# Patient Record
Sex: Female | Born: 1960 | Race: Black or African American | Hispanic: No | Marital: Married | Smoking: Former smoker
Health system: Southern US, Community
[De-identification: ages and names within clinical notes are randomized; demographics above are authoritative.]

## PROBLEM LIST (undated history)

## (undated) DIAGNOSIS — I639 Cerebral infarction, unspecified: Secondary | ICD-10-CM

## (undated) DIAGNOSIS — I10 Essential (primary) hypertension: Secondary | ICD-10-CM

## (undated) DIAGNOSIS — E785 Hyperlipidemia, unspecified: Secondary | ICD-10-CM

## (undated) DIAGNOSIS — K219 Gastro-esophageal reflux disease without esophagitis: Secondary | ICD-10-CM

## (undated) DIAGNOSIS — E119 Type 2 diabetes mellitus without complications: Secondary | ICD-10-CM

## (undated) DIAGNOSIS — Z5189 Encounter for other specified aftercare: Secondary | ICD-10-CM

## (undated) DIAGNOSIS — D649 Anemia, unspecified: Secondary | ICD-10-CM

## (undated) HISTORY — DX: Hyperlipidemia, unspecified: E78.5

## (undated) HISTORY — DX: Essential (primary) hypertension: I10

## (undated) HISTORY — DX: Gastro-esophageal reflux disease without esophagitis: K21.9

## (undated) HISTORY — DX: Type 2 diabetes mellitus without complications: E11.9

## (undated) HISTORY — DX: Anemia, unspecified: D64.9

## (undated) HISTORY — DX: Encounter for other specified aftercare: Z51.89

## (undated) HISTORY — DX: Cerebral infarction, unspecified: I63.9

## (undated) HISTORY — PX: UTERINE FIBROID SURGERY: SHX826

---

## 2008-09-15 ENCOUNTER — Emergency Department: Payer: Self-pay | Admitting: Emergency Medicine

## 2010-02-20 ENCOUNTER — Inpatient Hospital Stay: Payer: Self-pay | Admitting: Internal Medicine

## 2010-06-06 ENCOUNTER — Emergency Department: Payer: Self-pay | Admitting: Emergency Medicine

## 2013-04-27 ENCOUNTER — Encounter: Payer: Self-pay | Admitting: *Deleted

## 2013-05-26 ENCOUNTER — Encounter: Payer: Self-pay | Admitting: General Surgery

## 2013-05-26 ENCOUNTER — Ambulatory Visit (INDEPENDENT_AMBULATORY_CARE_PROVIDER_SITE_OTHER): Payer: BC Managed Care – PPO | Admitting: General Surgery

## 2013-05-26 VITALS — BP 130/78 | HR 74 | Resp 12 | Ht 68.0 in | Wt 261.0 lb

## 2013-05-26 DIAGNOSIS — Z1211 Encounter for screening for malignant neoplasm of colon: Secondary | ICD-10-CM

## 2013-05-26 MED ORDER — POLYETHYLENE GLYCOL 3350 17 GM/SCOOP PO POWD: ORAL | Status: DC

## 2013-05-26 NOTE — Addendum Note (Signed)
Addended by: Kieth Brightly on: 05/26/2013 10:04 AM   Modules accepted: Orders

## 2013-05-26 NOTE — Patient Instructions (Addendum)
Colonoscopy A colonoscopy is an exam to evaluate your entire colon. In this exam, your colon is cleansed. A long fiberoptic tube is inserted through your rectum and into your colon. The fiberoptic scope (endoscope) is a long bundle of enclosed and very flexible fibers. These fibers transmit light to the area examined and send images from that area to your caregiver. Discomfort is usually minimal. You may be given a drug to help you sleep (sedative) during or prior to the procedure. This exam helps to detect lumps (tumors), polyps, inflammation, and areas of bleeding. Your caregiver may also take a small piece of tissue (biopsy) that will be examined under a microscope. LET YOUR CAREGIVER KNOW ABOUT:   Allergies to food or medicine.  Medicines taken, including vitamins, herbs, eyedrops, over-the-counter medicines, and creams.  Use of steroids (by mouth or creams).  Previous problems with anesthetics or numbing medicines.  History of bleeding problems or blood clots.  Previous surgery.  Other health problems, including diabetes and kidney problems.  Possibility of pregnancy, if this applies. BEFORE THE PROCEDURE   A clear liquid diet may be required for 2 days before the exam.  Ask your caregiver about changing or stopping your regular medications.  Liquid injections (enemas) or laxatives may be required.  A large amount of electrolyte solution may be given to you to drink over a short period of time. This solution is used to clean out your colon.  You should be present 60 minutes prior to your procedure or as directed by your caregiver. AFTER THE PROCEDURE   If you received a sedative or pain relieving medication, you will need to arrange for someone to drive you home.  Occasionally, there is a little blood passed with the first bowel movement. Do not be concerned. FINDING OUT THE RESULTS OF YOUR TEST Not all test results are available during your visit. If your test results are  not back during the visit, make an appointment with your caregiver to find out the results. Do not assume everything is normal if you have not heard from your caregiver or the medical facility. It is important for you to follow up on all of your test results. HOME CARE INSTRUCTIONS   It is not unusual to pass moderate amounts of gas and experience mild abdominal cramping following the procedure. This is due to air being used to inflate your colon during the exam. Walking or a warm pack on your belly (abdomen) may help.  You may resume all normal meals and activities after sedatives and medicines have worn off.  Only take over-the-counter or prescription medicines for pain, discomfort, or fever as directed by your caregiver. Do not use aspirin or blood thinners if a biopsy was taken. Consult your caregiver for medicine usage if biopsies were taken. SEEK IMMEDIATE MEDICAL CARE IF:   You have a fever.  You pass large blood clots or fill a toilet with blood following the procedure. This may also occur 10 to 14 days following the procedure. This is more likely if a biopsy was taken.  You develop abdominal pain that keeps getting worse and cannot be relieved with medicine. Document Released: 05/23/2000 Document Revised: 08/18/2011 Document Reviewed: 12/27/2012 [facility] Patient Information 2014 [place], [place].  Patient has been scheduled for a colonoscopy on 06-08-13 at [facility]. This patient has been asked to hold metformin day of colonoscopy prep and procedure. It is okay for her to continue her glipizide day of prep but will hold day of procedure.

## 2013-05-26 NOTE — Progress Notes (Signed)
Patient ID: Maria Gordon, female   DOB: 31-May-1961, 52 y.o.   MRN: [id number]  Chief Complaint  Patient presents with  . Other    colonoscopy    HPI Maria Gordon is a 52 y.o. female.  Here today to discuss having a colonoscopy.  She has not had any in the past. Denies any gastrointestinal issues.  HPI  Past Medical History  Diagnosis Date  . Hypertension   . Diabetes mellitus without complication   . Stroke   . Hyperlipidemia   . Anemia   . Blood transfusion without reported diagnosis   . GERD (gastroesophageal reflux disease)     Past Surgical History  Procedure Laterality Date  . Uterine fibroid surgery      Family History  Problem Relation Age of Onset  . Lung cancer Brother     Social History History  Substance Use Topics  . Smoking status: Former Smoker -- 1.00 packs/day for 25 years  . Smokeless tobacco: Never Used  . Alcohol Use: No    Allergies  Allergen Reactions  . Shrimp [Shellfish Allergy] Rash    Current Outpatient Prescriptions  Medication Sig Dispense Refill  . atorvastatin (LIPITOR) 40 MG tablet Take 1 tablet by mouth daily at 6 (six) AM.      . carvedilol (COREG) 6.25 MG tablet Take 1 tablet by mouth daily.      Marland Kitchen GLIPIZIDE XL 5 MG 24 hr tablet Take 1 tablet by mouth daily.      Marland Kitchen lisinopril-hydrochlorothiazide (PRINZIDE,ZESTORETIC) 20-12.5 MG per tablet Take 1 tablet by mouth daily.      . metFORMIN (GLUCOPHAGE-XR) 500 MG 24 hr tablet Take 1 tablet by mouth daily.      . polyethylene glycol powder (GLYCOLAX/MIRALAX) powder 255 grams one bottle for colonoscopy prep  255 g  0   No current facility-administered medications for this visit.    Review of Systems Review of Systems  Constitutional: Negative.   Respiratory: Negative.   Cardiovascular: Negative.   Gastrointestinal: Negative.     Blood pressure 130/78, pulse 74, resp. rate 12, height 5\' 8"  (1.727 m), weight 261 lb (118.389 kg).  Physical Exam Physical Exam   Constitutional: She is oriented to person, place, and time. She appears well-developed and well-nourished.  Eyes: Conjunctivae are normal. No scleral icterus.  Neck: Neck supple.  Cardiovascular: Normal rate, regular rhythm and normal heart sounds.   Pulmonary/Chest: Effort normal and breath sounds normal.  Abdominal: Soft. Bowel sounds are normal. There is no tenderness. No hernia.  Lymphadenopathy:    She has no cervical adenopathy.  Neurological: She is alert and oriented to person, place, and time.  Skin: Skin is warm and dry.    Data Reviewed none  Assessment    Stable physical exam.    Plan    Colonoscopy with possible biopsy/polypectomy prn: Information regarding the procedure, including its potential risks and complications (including but not limited to perforation of the bowel, which may require emergency surgery to repair, and bleeding) was verbally given to the patient. Educational information regarding lower instestinal endoscopy was given to the patient. Written instructions for how to complete the bowel prep using Miralax were provided. The importance of drinking ample fluids to avoid dehydration as a result of the prep emphasized.    Patient has been scheduled for a colonoscopy on 06-08-13 at [facility]. This patient has been asked to hold metformin day of colonoscopy prep and procedure. It is okay for her to continue her glipizide  day of prep but will hold day of procedure.    Lunetta Marina G 05/26/2013, 9:49 AM

## 2013-06-08 ENCOUNTER — Ambulatory Visit: Payer: Self-pay | Admitting: General Surgery

## 2013-06-08 DIAGNOSIS — Z1211 Encounter for screening for malignant neoplasm of colon: Secondary | ICD-10-CM

## 2013-06-14 ENCOUNTER — Encounter: Payer: Self-pay | Admitting: General Surgery

## 2014-04-10 ENCOUNTER — Encounter: Payer: Self-pay | Admitting: General Surgery

## 2015-03-15 ENCOUNTER — Emergency Department: Payer: Self-pay

## 2015-03-15 ENCOUNTER — Other Ambulatory Visit: Payer: Self-pay

## 2015-03-15 ENCOUNTER — Emergency Department
Admission: EM | Admit: 2015-03-15 | Discharge: 2015-03-15 | Disposition: A | Discharge status: Home / Self Care | Payer: Self-pay | Attending: Emergency Medicine | Admitting: Emergency Medicine

## 2015-03-15 DIAGNOSIS — J40 Bronchitis, not specified as acute or chronic: Secondary | ICD-10-CM | POA: Insufficient documentation

## 2015-03-15 DIAGNOSIS — Z7982 Long term (current) use of aspirin: Secondary | ICD-10-CM | POA: Insufficient documentation

## 2015-03-15 DIAGNOSIS — E041 Nontoxic single thyroid nodule: Secondary | ICD-10-CM | POA: Insufficient documentation

## 2015-03-15 DIAGNOSIS — I10 Essential (primary) hypertension: Secondary | ICD-10-CM | POA: Insufficient documentation

## 2015-03-15 DIAGNOSIS — E278 Other specified disorders of adrenal gland: Secondary | ICD-10-CM | POA: Insufficient documentation

## 2015-03-15 DIAGNOSIS — Z79899 Other long term (current) drug therapy: Secondary | ICD-10-CM | POA: Insufficient documentation

## 2015-03-15 DIAGNOSIS — Z87891 Personal history of nicotine dependence: Secondary | ICD-10-CM | POA: Insufficient documentation

## 2015-03-15 DIAGNOSIS — F419 Anxiety disorder, unspecified: Secondary | ICD-10-CM | POA: Insufficient documentation

## 2015-03-15 DIAGNOSIS — E119 Type 2 diabetes mellitus without complications: Secondary | ICD-10-CM | POA: Insufficient documentation

## 2015-03-15 LAB — COMPREHENSIVE METABOLIC PANEL
ALBUMIN: 4.4 g/dL (ref 3.5–5.0)
ALT: 47 U/L (ref 14–54)
ANION GAP: 8 (ref 5–15)
AST: 41 U/L (ref 15–41)
Alkaline Phosphatase: 95 U/L (ref 38–126)
BUN: 8 mg/dL (ref 6–20)
CO2: 26 mmol/L (ref 22–32)
Calcium: 9.5 mg/dL (ref 8.9–10.3)
Chloride: 105 mmol/L (ref 101–111)
Creatinine, Ser: 0.69 mg/dL (ref 0.44–1.00)
GFR calc Af Amer: >60 mL/min (ref >60)
GFR calc non Af Amer: >60 mL/min (ref >60)
GLUCOSE: 180 mg/dL — AB (ref 65–99)
POTASSIUM: 3.9 mmol/L (ref 3.5–5.1)
SODIUM: 139 mmol/L (ref 135–145)
Total Bilirubin: 0.5 mg/dL (ref 0.3–1.2)
Total Protein: 8.2 g/dL — ABNORMAL HIGH (ref 6.5–8.1)

## 2015-03-15 LAB — CBC WITH DIFFERENTIAL/PLATELET
BASOS PCT: 1 %
Basophils Absolute: 0.1 10*3/uL (ref 0–0.1)
EOS ABS: 0.1 10*3/uL (ref 0–0.7)
Eosinophils Relative: 1 %
HCT: 40.3 % (ref 35.0–47.0)
HEMOGLOBIN: 12.9 g/dL (ref 12.0–16.0)
Lymphocytes Relative: 26 %
Lymphs Abs: 2.3 10*3/uL (ref 1.0–3.6)
MCH: 25.5 pg — ABNORMAL LOW (ref 26.0–34.0)
MCHC: 32.1 g/dL (ref 32.0–36.0)
MCV: 79.6 fL — ABNORMAL LOW (ref 80.0–100.0)
MONO ABS: 0.5 10*3/uL (ref 0.2–0.9)
MONOS PCT: 6 %
NEUTROS PCT: 66 %
Neutro Abs: 5.8 10*3/uL (ref 1.4–6.5)
Platelets: 193 10*3/uL (ref 150–440)
RBC: 5.07 MIL/uL (ref 3.80–5.20)
RDW: 14.1 % (ref 11.5–14.5)
WBC: 8.7 10*3/uL (ref 3.6–11.0)

## 2015-03-15 LAB — TROPONIN I: Troponin I: <0.03 ng/mL (ref <0.031)

## 2015-03-15 LAB — FIBRIN DERIVATIVES D-DIMER (ARMC ONLY): Fibrin derivatives D-dimer (ARMC): 816 — ABNORMAL HIGH (ref 0–499)

## 2015-03-15 MED ORDER — IPRATROPIUM-ALBUTEROL 0.5-2.5 (3) MG/3ML IN SOLN
3.0000 mL | Freq: Once | RESPIRATORY_TRACT | Status: AC
  Administered 2015-03-15: 3 mL via RESPIRATORY_TRACT
  Filled 2015-03-15: qty 3

## 2015-03-15 MED ORDER — LORAZEPAM 1 MG PO TABS
1.0000 mg | ORAL_TABLET | Freq: Once | ORAL | Status: AC
  Administered 2015-03-15: 1 mg via ORAL
  Filled 2015-03-15: qty 1

## 2015-03-15 MED ORDER — SODIUM CHLORIDE 0.9 % IV BOLUS (SEPSIS)
1000.0000 mL | Freq: Once | INTRAVENOUS | Status: AC
  Administered 2015-03-15: 1000 mL via INTRAVENOUS

## 2015-03-15 MED ORDER — IOHEXOL 350 MG/ML SOLN
100.0000 mL | Freq: Once | INTRAVENOUS | Status: AC | PRN
  Administered 2015-03-15: 100 mL via INTRAVENOUS

## 2015-03-15 MED ORDER — ALBUTEROL SULFATE (2.5 MG/3ML) 0.083% IN NEBU
2.5000 mg | INHALATION_SOLUTION | RESPIRATORY_TRACT | Status: DC | PRN
  Administered 2015-03-15: 2.5 mg via RESPIRATORY_TRACT
  Filled 2015-03-15: qty 3

## 2015-03-15 MED ORDER — ALPRAZOLAM 0.25 MG PO TABS: 0.2500 mg | ORAL_TABLET | Freq: Three times a day (TID) | ORAL | Status: AC | PRN

## 2015-03-15 MED ORDER — ALBUTEROL SULFATE HFA 108 (90 BASE) MCG/ACT IN AERS: 2.0000 | INHALATION_SPRAY | RESPIRATORY_TRACT | Status: DC | PRN

## 2015-03-15 MED ORDER — ALBUTEROL SULFATE HFA 108 (90 BASE) MCG/ACT IN AERS: 2.0000 | INHALATION_SPRAY | Freq: Four times a day (QID) | RESPIRATORY_TRACT | Status: AC | PRN

## 2015-03-15 NOTE — ED Notes (Signed)
Pt ambulatory to triage with reports that she has been feeling "like I'm smothering" x 2 days. Pt denies pain, denies cough. Reports some dizziness.

## 2015-03-15 NOTE — ED Provider Notes (Signed)
CSN: [id number]     Arrival date & time 03/15/15  7017 History   First MD Initiated Contact with Patient 03/15/15 0825     Chief Complaint  Patient presents with  . Shortness of Breath     (Consider location/radiation/quality/duration/timing/severity/associated sxs/prior Treatment) The history is provided by the patient.    Maria Gordon is a 54 y.o. female hx of HTN, DM, HL, here presenting with shortness of breath, anxiety, dizziness. Patient had some dizziness a week ago that improved. States that dizziness worse when she stands up but denies any chest pain or passing out. Since yesterday she has been having some subjective shortness of breath. She feels that "she has been smothering". She has trouble catching her breath at night and was unable to sleep yesterday. Denies any cough or fevers. Didn't take her blood pressure medicine this morning but denies any chest pain. No recent travel, no hx of DVT/PE.    Past Medical History  Diagnosis Date  . Hypertension   . Diabetes mellitus without complication ([place])   . Stroke ([place])   . Hyperlipidemia   . Anemia   . Blood transfusion without reported diagnosis   . GERD (gastroesophageal reflux disease)    Past Surgical History  Procedure Laterality Date  . Uterine fibroid surgery     Family History  Problem Relation Age of Onset  . Lung cancer Brother    Social History  Substance Use Topics  . Smoking status: Former Smoker -- 1.00 packs/day for 25 years  . Smokeless tobacco: Never Used  . Alcohol Use: No   OB History    Gravida Para Term Preterm AB TAB SAB Ectopic Multiple Living   2 1   1  1   1       Obstetric Comments   1st Menstrual Cycle:  12 1st Pregnancy:  15     Review of Systems  Respiratory: Positive for shortness of breath.   All other systems reviewed and are negative.     Allergies  Shrimp  Home Medications   Prior to Admission medications   Medication Sig Start Date End Date Taking? Authorizing  Provider  aspirin EC 81 MG tablet Take 81 mg by mouth daily.   Yes Historical Provider, MD  GLIPIZIDE XL 5 MG 24 hr tablet Take 1 tablet by mouth daily. 05/13/13  Yes Historical Provider, MD  lisinopril-hydrochlorothiazide (PRINZIDE,ZESTORETIC) 20-12.5 MG per tablet Take 1 tablet by mouth daily. 05/13/13  Yes Historical Provider, MD  metFORMIN (GLUCOPHAGE-XR) 500 MG 24 hr tablet Take 1 tablet by mouth daily. 04/22/13  Yes Historical Provider, MD   BP 169/82 mmHg  Pulse 94  Temp(Src) 98 F (36.7 C) (Oral)  Resp 14  Ht 5\' 9"  (1.753 m)  Wt 265 lb (120.203 kg)  BMI 39.12 kg/m2  SpO2 100% Physical Exam  Constitutional: She is oriented to person, place, and time. She appears well-developed.  Slightly anxious   HENT:  Head: Normocephalic.  Mouth/Throat: Oropharynx is clear and moist.  Eyes: Conjunctivae are normal. Pupils are equal, round, and reactive to light.  Neck: Normal range of motion. Neck supple.  Cardiovascular: Normal rate, regular rhythm and normal heart sounds.   Pulmonary/Chest: Effort normal and breath sounds normal. No respiratory distress. She has no wheezes. She has no rales.  Abdominal: Soft. Bowel sounds are normal. She exhibits no distension. There is no tenderness. There is no rebound.  Musculoskeletal: Normal range of motion. She exhibits no edema or tenderness.  Neurological: She  is alert and oriented to person, place, and time.  Skin: Skin is warm and dry.  Psychiatric: She has a normal mood and affect. Her behavior is normal. Judgment and thought content normal.  Nursing note and vitals reviewed.   ED Course  Procedures (including critical care time)   Angiocath insertion Performed by: Darl Householder, Aubrina Nieman  Consent: Verbal consent obtained. Risks and benefits: risks, benefits and alternatives were discussed Time out: Immediately prior to procedure a "time out" was called to verify the correct patient, procedure, equipment, support staff and site/side marked as  required.  Preparation: Patient was prepped and draped in the usual sterile fashion.  Vein Location: L antecube  Ultrasound Guided  Gauge: 20 long  Normal blood return and flush without difficulty Patient tolerance: Patient tolerated the procedure well with no immediate complications.    Labs Review Labs Reviewed  CBC WITH DIFFERENTIAL/PLATELET - Abnormal; Notable for the following:    MCV 79.6 (*)    MCH 25.5 (*)    All other components within normal limits  COMPREHENSIVE METABOLIC PANEL - Abnormal; Notable for the following:    Glucose, Bld 180 (*)    Total Protein 8.2 (*)    All other components within normal limits  FIBRIN DERIVATIVES D-DIMER ([facility] ONLY) - Abnormal; Notable for the following:    Fibrin derivatives D-dimer [facility]) 816 (*)    All other components within normal limits  TROPONIN I    Imaging Review Dg Chest 2 View  03/15/2015   CLINICAL DATA:  Shortness of breath for 3 days.  EXAM: CHEST  2 VIEW  COMPARISON:  None available.  FINDINGS: The cardiac silhouette, mediastinal and hilar contours are within normal limits. There is mild peribronchial thickening and slight increased interstitial markings which could reflect bronchitis or interstitial pneumonitis. No focal infiltrates or pleural effusion. The bony thorax is intact.  IMPRESSION: Mild peribronchial thickening and increased interstitial markings could reflect changes of smoking, bronchitis or interstitial pneumonitis. No focal infiltrates or effusions.   Electronically Signed   By: Marijo Sanes M.D.   On: 03/15/2015 08:50   Ct Angio Chest Pe W/cm &/or Wo Cm  03/15/2015   CLINICAL DATA:  Shortness of breath.  EXAM: CT ANGIOGRAPHY CHEST WITH CONTRAST  TECHNIQUE: Multidetector CT imaging of the chest was performed using the standard protocol during bolus administration of intravenous contrast. Multiplanar CT image reconstructions and MIPs were obtained to evaluate the vascular anatomy.  CONTRAST:  126mL OMNIPAQUE  IOHEXOL 350 MG/ML SOLN  COMPARISON:  Chest radiograph of same day.  FINDINGS: No pneumothorax or pleural effusion is noted. No acute pulmonary disease is noted. 2.2 cm left thyroid nodule with peripheral calcifications is noted. There is no evidence of pulmonary embolus. There is no evidence of thoracic aortic dissection or aneurysm. Great vessels are widely patent. No mediastinal mass or adenopathy is noted. No significant osseous abnormalities noted in chest. 36 x 25 mm left adrenal mass is noted with average Hounsfield measurement of 27.  Review of the MIP images confirms the above findings.  IMPRESSION: No evidence of pulmonary embolus.  2.2 cm left thyroid nodule is noted with peripheral calcifications. Thyroid ultrasound is recommended for further evaluation.  3.6 cm left adrenal mass is noted. This may simply represent benign adenoma, but malignancy cannot be excluded. Further evaluation with follow-up MRI or CT scan in 6 months is recommended.   Electronically Signed   By: Marijo Conception, M.D.   On: 03/15/2015 12:51   I have  personally reviewed and evaluated these images and lab results as part of my medical decision-making.   EKG Interpretation None       ED ECG REPORT I, Claron Rosencrans, the attending physician, personally viewed and interpreted this ECG.   Date: 03/15/2015  EKG Time: 8:24  Rate: 89  Rhythm: normal EKG, normal sinus rhythm, unchanged from previous tracings  Axis: normal  Intervals:none  ST&T Change: nonspecific    MDM   Final diagnoses:  None    Maria Gordon is a 54 y.o. female here with subjective shortness of breath. Likely anxiety but consider ACS vs PE as well. Symptoms for more than 24 hrs so trop neg x 1 sufficient. Will get d-dimer and labs.   12 pm  D dimer elevated. CXR showed possible bronchitis. No wheezing but diminished air movements. Given duoneb and has better air movements now. Will get CT angio. Placed US guided IV since she is a difficult  access.   1:57 PM CT showed no PE, but has thyroid nodules. Also has 3.6 cm L adrenal mass. I wonder if that's why she has hypertension. Can get pheo workup outpatient and get US thyroid outpatient. Shortness of breath likely anxiety or bronchitis. No wheezing after nebs. Felt better. Never hypoxic. Will dc home.   Wandra Arthurs, MD 03/15/15 [phone]

## 2015-03-15 NOTE — ED Notes (Signed)
CT notified pt ready for CT Angio

## 2015-03-15 NOTE — Discharge Instructions (Signed)
Use albuterol every 4-6 hrs for shortness of breath.  Take xanax as needed for anxiety.   Take your blood pressure meds as prescribed.   You have nodules on your thyroid. You may need an ultrasound with your doctor.  You have a mass on your adrenal gland above your kidney. You need to talk to you doctor about it.   Return to ER if you have worse shortness of breath, trouble breathing, abdominal pain, uncontrolled blood pressure.

## 2015-03-29 ENCOUNTER — Other Ambulatory Visit: Payer: Self-pay | Admitting: Family Medicine

## 2015-03-29 DIAGNOSIS — Z1239 Encounter for other screening for malignant neoplasm of breast: Secondary | ICD-10-CM

## 2015-03-29 DIAGNOSIS — E041 Nontoxic single thyroid nodule: Secondary | ICD-10-CM

## 2015-04-06 ENCOUNTER — Ambulatory Visit
Admission: RE | Admit: 2015-04-06 | Discharge: 2015-04-06 | Disposition: A | Discharge status: Home / Self Care | Payer: BLUE CROSS/BLUE SHIELD | Source: Ambulatory Visit | Attending: Family Medicine | Admitting: Family Medicine

## 2015-04-06 DIAGNOSIS — E042 Nontoxic multinodular goiter: Secondary | ICD-10-CM | POA: Insufficient documentation

## 2015-04-06 DIAGNOSIS — E041 Nontoxic single thyroid nodule: Secondary | ICD-10-CM

## 2015-09-03 ENCOUNTER — Other Ambulatory Visit: Payer: Self-pay | Admitting: Family Medicine

## 2015-09-03 DIAGNOSIS — E278 Other specified disorders of adrenal gland: Secondary | ICD-10-CM

## 2015-09-09 ENCOUNTER — Encounter: Payer: Self-pay | Admitting: Emergency Medicine

## 2015-09-09 ENCOUNTER — Emergency Department
Admission: EM | Admit: 2015-09-09 | Discharge: 2015-09-09 | Disposition: A | Discharge status: Home / Self Care | Payer: BLUE CROSS/BLUE SHIELD | Attending: Student | Admitting: Student

## 2015-09-09 DIAGNOSIS — E119 Type 2 diabetes mellitus without complications: Secondary | ICD-10-CM | POA: Diagnosis not present

## 2015-09-09 DIAGNOSIS — Z7984 Long term (current) use of oral hypoglycemic drugs: Secondary | ICD-10-CM | POA: Diagnosis not present

## 2015-09-09 DIAGNOSIS — B9789 Other viral agents as the cause of diseases classified elsewhere: Secondary | ICD-10-CM

## 2015-09-09 DIAGNOSIS — Z7982 Long term (current) use of aspirin: Secondary | ICD-10-CM | POA: Insufficient documentation

## 2015-09-09 DIAGNOSIS — Z87891 Personal history of nicotine dependence: Secondary | ICD-10-CM | POA: Insufficient documentation

## 2015-09-09 DIAGNOSIS — J069 Acute upper respiratory infection, unspecified: Secondary | ICD-10-CM | POA: Insufficient documentation

## 2015-09-09 DIAGNOSIS — Z8673 Personal history of transient ischemic attack (TIA), and cerebral infarction without residual deficits: Secondary | ICD-10-CM | POA: Insufficient documentation

## 2015-09-09 DIAGNOSIS — Z79899 Other long term (current) drug therapy: Secondary | ICD-10-CM | POA: Insufficient documentation

## 2015-09-09 DIAGNOSIS — E785 Hyperlipidemia, unspecified: Secondary | ICD-10-CM | POA: Diagnosis not present

## 2015-09-09 DIAGNOSIS — I10 Essential (primary) hypertension: Secondary | ICD-10-CM | POA: Insufficient documentation

## 2015-09-09 DIAGNOSIS — R05 Cough: Secondary | ICD-10-CM | POA: Diagnosis present

## 2015-09-09 DIAGNOSIS — J988 Other specified respiratory disorders: Secondary | ICD-10-CM

## 2015-09-09 LAB — POCT RAPID STREP A: Streptococcus, Group A Screen (Direct): NEGATIVE

## 2015-09-09 MED ORDER — IBUPROFEN 800 MG PO TABS: 800.0000 mg | ORAL_TABLET | Freq: Three times a day (TID) | ORAL | Status: AC | PRN

## 2015-09-09 NOTE — Discharge Instructions (Signed)

## 2015-09-09 NOTE — ED Provider Notes (Signed)
CSN: [id number]     Arrival date & time 09/09/15  1120 History   First MD Initiated Contact with Patient 09/09/15 1159     Chief Complaint  Patient presents with  . Influenza     (Consider location/radiation/quality/duration/timing/severity/associated sxs/prior Treatment) HPI  55 year old female presents to the emergency department for evaluation of sore throat, cough, body aches that are present for 3-4 days. She describes mild sore throat with sinus pressure and congestion. Nonproductive cough. She has been taking Mucinex with mild improvement. No Tylenol or ibuprofen. She describes subjective fevers that are intermittent. No nausea vomiting or diarrhea. No chest pain or shortness of breath.  Past Medical History  Diagnosis Date  . Hypertension   . Diabetes mellitus without complication ([place])   . Stroke ([place])   . Hyperlipidemia   . Anemia   . Blood transfusion without reported diagnosis   . GERD (gastroesophageal reflux disease)    Past Surgical History  Procedure Laterality Date  . Uterine fibroid surgery     Family History  Problem Relation Age of Onset  . Lung cancer Brother    Social History  Substance Use Topics  . Smoking status: Former Smoker -- 1.00 packs/day for 25 years  . Smokeless tobacco: Never Used  . Alcohol Use: No   OB History    Gravida Para Term Preterm AB TAB SAB Ectopic Multiple Living   2 1   1  1   1       Obstetric Comments   1st Menstrual Cycle:  12 1st Pregnancy:  15     Review of Systems  Constitutional: Negative for fever, chills, activity change and fatigue.  HENT: Positive for congestion and sore throat. Negative for sinus pressure.   Eyes: Negative for visual disturbance.  Respiratory: Positive for cough. Negative for chest tightness and shortness of breath.   Cardiovascular: Negative for chest pain and leg swelling.  Gastrointestinal: Negative for nausea, vomiting, abdominal pain and diarrhea.  Genitourinary: Negative for dysuria.   Musculoskeletal: Negative for arthralgias and gait problem.  Skin: Negative for rash.  Neurological: Negative for weakness, numbness and headaches.  Hematological: Negative for adenopathy.  Psychiatric/Behavioral: Negative for behavioral problems, confusion and agitation.      Allergies  Shrimp  Home Medications   Prior to Admission medications   Medication Sig Start Date End Date Taking? Authorizing Provider  albuterol (PROVENTIL HFA;VENTOLIN HFA) 108 (90 BASE) MCG/ACT inhaler Inhale 2 puffs into the lungs every 6 (six) hours as needed for wheezing or shortness of breath. 03/15/15   Wandra Arthurs, MD  ALPRAZolam Duanne Moron) 0.25 MG tablet Take 1 tablet (0.25 mg total) by mouth 3 (three) times daily as needed for anxiety. 03/15/15 03/14/16  Wandra Arthurs, MD  aspirin EC 81 MG tablet Take 81 mg by mouth daily.    Historical Provider, MD  GLIPIZIDE XL 5 MG 24 hr tablet Take 1 tablet by mouth daily. 05/13/13   Historical Provider, MD  ibuprofen (ADVIL,MOTRIN) 800 MG tablet Take 1 tablet (800 mg total) by mouth every 8 (eight) hours as needed. 09/09/15   Duanne Guess, PA-C  lisinopril-hydrochlorothiazide (PRINZIDE,ZESTORETIC) 20-12.5 MG per tablet Take 1 tablet by mouth daily. 05/13/13   Historical Provider, MD  metFORMIN (GLUCOPHAGE-XR) 500 MG 24 hr tablet Take 1 tablet by mouth daily. 04/22/13   Historical Provider, MD   BP 167/79 mmHg  Pulse 99  Temp(Src) 98.2 F (36.8 C) (Oral)  Resp 18  Ht 5\' 9"  (1.753 m)  Wt  120.203 kg  BMI 39.12 kg/m2  SpO2 97% Physical Exam  Constitutional: She is oriented to person, place, and time. She appears well-developed and well-nourished. No distress.  HENT:  Head: Normocephalic and atraumatic.  Right Ear: External ear normal.  Left Ear: External ear normal.  Nose: Nose normal.  Mouth/Throat: Oropharynx is clear and moist. No trismus in the jaw. No uvula swelling. No oropharyngeal exudate, posterior oropharyngeal erythema or tonsillar abscesses.  Eyes: EOM  are normal. Pupils are equal, round, and reactive to light. Right eye exhibits no discharge. Left eye exhibits no discharge.  Neck: Normal range of motion. Neck supple.  Cardiovascular: Normal rate, regular rhythm and intact distal pulses.   Pulmonary/Chest: Effort normal and breath sounds normal. No respiratory distress. She exhibits no tenderness.  Abdominal: Soft. She exhibits no distension. There is no tenderness.  Musculoskeletal: Normal range of motion. She exhibits no edema.  Lymphadenopathy:    She has cervical adenopathy (posterior cervical).  Neurological: She is alert and oriented to person, place, and time. She has normal reflexes.  Skin: Skin is warm and dry.  Psychiatric: She has a normal mood and affect. Her behavior is normal. Thought content normal.    ED Course  Procedures (including critical care time) Labs Review Labs Reviewed  POCT RAPID STREP A    Imaging Review No results found. I have personally reviewed and evaluated these images and lab results as part of my medical decision-making.   EKG Interpretation None      MDM   Final diagnoses:  Viral respiratory illness    55 year old female with viral illness. Symptoms present for 3-4 days. She is educated on signs and symptoms return to the emergency department for. She will take ibuprofen as needed for body aches, fevers. She will take over-the-counter cough and congestion medication.    Duanne Guess, PA-C 09/09/15 1255  Joanne Gavel, MD 09/09/15 [phone]

## 2015-09-09 NOTE — ED Notes (Signed)
Patient presents to the ED via POV with cough chills sore throat bodyaches. Hx of exposure to influenza

## 2015-09-09 NOTE — ED Notes (Signed)
NAD noted at time of D/C. Pt denies questions or concerns. Pt ambulatory to the lobby at this time.  

## 2015-10-15 ENCOUNTER — Other Ambulatory Visit: Payer: Self-pay | Admitting: Family Medicine

## 2015-10-15 DIAGNOSIS — E278 Other specified disorders of adrenal gland: Secondary | ICD-10-CM

## 2015-10-16 ENCOUNTER — Ambulatory Visit: Admission: RE | Admit: 2015-10-16 | Payer: BLUE CROSS/BLUE SHIELD | Source: Ambulatory Visit

## 2015-10-26 ENCOUNTER — Other Ambulatory Visit: Payer: BLUE CROSS/BLUE SHIELD

## 2015-10-26 ENCOUNTER — Inpatient Hospital Stay: Admission: RE | Admit: 2015-10-26 | Payer: BLUE CROSS/BLUE SHIELD | Source: Ambulatory Visit

## 2015-10-26 ENCOUNTER — Ambulatory Visit
Admission: RE | Admit: 2015-10-26 | Discharge: 2015-10-26 | Disposition: A | Discharge status: Home / Self Care | Payer: BLUE CROSS/BLUE SHIELD | Source: Ambulatory Visit | Attending: Family Medicine | Admitting: Family Medicine

## 2015-10-26 DIAGNOSIS — K5730 Diverticulosis of large intestine without perforation or abscess without bleeding: Secondary | ICD-10-CM | POA: Insufficient documentation

## 2015-10-26 DIAGNOSIS — E278 Other specified disorders of adrenal gland: Secondary | ICD-10-CM | POA: Insufficient documentation

## 2015-10-26 DIAGNOSIS — D3502 Benign neoplasm of left adrenal gland: Secondary | ICD-10-CM | POA: Diagnosis not present

## 2016-05-23 ENCOUNTER — Encounter: Payer: Self-pay | Admitting: Emergency Medicine

## 2016-05-23 ENCOUNTER — Emergency Department
Admission: EM | Admit: 2016-05-23 | Discharge: 2016-05-23 | Disposition: A | Discharge status: Home / Self Care | Payer: BLUE CROSS/BLUE SHIELD | Attending: Emergency Medicine | Admitting: Emergency Medicine

## 2016-05-23 ENCOUNTER — Emergency Department: Payer: BLUE CROSS/BLUE SHIELD

## 2016-05-23 DIAGNOSIS — E119 Type 2 diabetes mellitus without complications: Secondary | ICD-10-CM | POA: Diagnosis not present

## 2016-05-23 DIAGNOSIS — Z7982 Long term (current) use of aspirin: Secondary | ICD-10-CM | POA: Diagnosis not present

## 2016-05-23 DIAGNOSIS — M79605 Pain in left leg: Secondary | ICD-10-CM | POA: Insufficient documentation

## 2016-05-23 DIAGNOSIS — M5410 Radiculopathy, site unspecified: Secondary | ICD-10-CM

## 2016-05-23 DIAGNOSIS — M5416 Radiculopathy, lumbar region: Secondary | ICD-10-CM | POA: Insufficient documentation

## 2016-05-23 DIAGNOSIS — I10 Essential (primary) hypertension: Secondary | ICD-10-CM | POA: Insufficient documentation

## 2016-05-23 DIAGNOSIS — M545 Low back pain: Secondary | ICD-10-CM | POA: Diagnosis present

## 2016-05-23 DIAGNOSIS — Z87891 Personal history of nicotine dependence: Secondary | ICD-10-CM | POA: Diagnosis not present

## 2016-05-23 DIAGNOSIS — Z7984 Long term (current) use of oral hypoglycemic drugs: Secondary | ICD-10-CM | POA: Diagnosis not present

## 2016-05-23 LAB — GLUCOSE, CAPILLARY: GLUCOSE-CAPILLARY: 161 mg/dL — AB (ref 65–99)

## 2016-05-23 MED ORDER — ETODOLAC 400 MG PO TABS: 400.0000 mg | ORAL_TABLET | Freq: Two times a day (BID) | ORAL | 0 refills | Status: AC

## 2016-05-23 MED ORDER — TRAMADOL HCL 50 MG PO TABS: 50.0000 mg | ORAL_TABLET | Freq: Four times a day (QID) | ORAL | 0 refills | Status: AC | PRN

## 2016-05-23 NOTE — ED Triage Notes (Signed)
Left hip pain rad down leg

## 2016-05-23 NOTE — ED Provider Notes (Signed)
[facility] Emergency Department Provider Note  ____________________________________________   First MD Initiated Contact with Patient 05/23/16 1322     (approximate)  I have reviewed the triage vital signs and the nursing notes.   HISTORY  Chief Complaint Leg Pain   HPI Maria Gordon is a 55 y.o. female is no complaint of left leg pain for approximately 2 weeks. Patient states that it begins at her left hip and then radiates down her left leg. She denies any recent injury. Patient states that she stands at work and that at the end of her shift is difficult for her to TO her car due to her back pain and leg pain. Patient continues to be ambulatory and has continued her regular routine. Patient has been taking aspirin and Tylenol with complete relief of her back pain. Patient states that she has not been seen for her back pain and has not experienced leg pain prior to this event. Patient denies any previous injury to her back or leg. She denies any incontinence of bowel or bladder. Currently she rates her pain as an 8 out of 10.   Past Medical History:  Diagnosis Date  . Anemia   . Blood transfusion without reported diagnosis   . Diabetes mellitus without complication ([place])   . GERD (gastroesophageal reflux disease)   . Hyperlipidemia   . Hypertension   . Stroke [facility])     There are no active problems to display for this patient.   Past Surgical History:  Procedure Laterality Date  . UTERINE FIBROID SURGERY      Prior to Admission medications   Medication Sig Start Date End Date Taking? Authorizing Provider  albuterol (PROVENTIL HFA;VENTOLIN HFA) 108 (90 BASE) MCG/ACT inhaler Inhale 2 puffs into the lungs every 6 (six) hours as needed for wheezing or shortness of breath. 03/15/15   Drenda Freeze, MD  aspirin EC 81 MG tablet Take 81 mg by mouth daily.    Historical Provider, MD  etodolac (LODINE) 400 MG tablet Take 1 tablet (400 mg total) by  mouth 2 (two) times daily. 05/23/16   [name], PA-C  GLIPIZIDE XL 5 MG 24 hr tablet Take 1 tablet by mouth daily. 05/13/13   Historical Provider, MD  ibuprofen (ADVIL,MOTRIN) 800 MG tablet Take 1 tablet (800 mg total) by mouth every 8 (eight) hours as needed. 09/09/15   Duanne Guess, PA-C  lisinopril-hydrochlorothiazide (PRINZIDE,ZESTORETIC) 20-12.5 MG per tablet Take 1 tablet by mouth daily. 05/13/13   Historical Provider, MD  metFORMIN (GLUCOPHAGE-XR) 500 MG 24 hr tablet Take 1 tablet by mouth daily. 04/22/13   Historical Provider, MD  traMADol (ULTRAM) 50 MG tablet Take 1 tablet (50 mg total) by mouth every 6 (six) hours as needed. 05/23/16   [name], PA-C    Allergies Shrimp [shellfish allergy]  Family History  Problem Relation Age of Onset  . Lung cancer Brother     Social History Social History  Substance Use Topics  . Smoking status: Former Smoker    Packs/day: 1.00    Years: 25.00  . Smokeless tobacco: Never Used  . Alcohol use No    Review of Systems Constitutional: No fever/chills Cardiovascular: Denies chest pain. Respiratory: Denies shortness of breath. Gastrointestinal: No abdominal pain.  No nausea, no vomiting.  Genitourinary: Negative for dysuria. Musculoskeletal: Positive for back pain. Positive for left leg pain. Skin: Negative for rash. Neurological: Negative for headaches, focal weakness. Positive for left leg radiculopathy.  10-point ROS otherwise negative.  ____________________________________________   PHYSICAL EXAM:  VITAL SIGNS: ED Triage Vitals  Enc Vitals Group     BP 05/23/16 1216 (!) 171/89     Pulse Rate 05/23/16 1216 91     Resp 05/23/16 1216 18     Temp 05/23/16 1216 98.2 F (36.8 C)     Temp Source 05/23/16 1216 Oral     SpO2 05/23/16 1216 96 %     Weight 05/23/16 1214 216 lb (98 kg)     Height 05/23/16 1214 5\' 9"  (1.753 m)     Head Circumference --      Peak Flow --      Pain Score 05/23/16 1214 8     Pain  Loc --      Pain Edu? --      Excl. in [place]? --     Constitutional: Alert and oriented. Well appearing and in no acute distress.Obese Eyes: Conjunctivae are normal. PERRL. EOMI. Head: Atraumatic. Nose: No congestion/rhinnorhea. Neck: No stridor.   Cardiovascular: Normal rate, regular rhythm. Grossly normal heart sounds.  Good peripheral circulation. Respiratory: Normal respiratory effort.  No retractions. Lungs CTAB. Gastrointestinal: Soft and nontender. No distention.  No CVA tenderness. Musculoskeletal: Examination of the back there is no gross deformity noted there is no muscle spasms present. Range of motion is slow and guarded. There is marked tenderness on palpation of the left SI joint area and muscles surrounding. Straight leg raises were negative. Good muscle strength bilaterally. Normal gait was noted. There is no erythema or warmth present to the left leg and no soft tissue edema in comparison to the right leg. Neurologic:  Normal speech and language. No gross focal neurologic deficits are appreciated. Reflexes were 1+ bilaterally. No gait instability. Skin:  Skin is warm, dry and intact. No ecchymosis, abrasions, erythema noted. Psychiatric: Mood and affect are normal. Speech and behavior are normal.  ____________________________________________   LABS (all labs ordered are listed, but only abnormal results are displayed)  Labs Reviewed  GLUCOSE, CAPILLARY - Abnormal; Notable for the following:       Result Value   Glucose-Capillary 161 (*)    All other components within normal limits  CBG MONITORING, ED    RADIOLOGY  Lumbar spine x-ray per radiologist: IMPRESSION: 1. 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 2. No acute abnormality within the lumbar spine.  I, [name], personally viewed and evaluated these images (plain radiographs) as part of my medical decision making, as well as reviewing the written report by the  radiologist. ____________________________________________   PROCEDURES  Procedure(s) performed: None  Procedures  Critical Care performed: No  ____________________________________________   INITIAL IMPRESSION / ASSESSMENT AND PLAN / ED COURSE  Pertinent labs & imaging results that were available during my care of the patient were reviewed by me and considered in my medical decision making (see chart for details).    Clinical Course    Patient states she is on metformin and insulin but does not take these on a routine basis as "I've been working a lot". He also does not check her blood sugars and has no idea what her average is. Patient goes to [facility]. She was made aware that her lower back is arthritic.  Patient is to follow-up with her primary care doctor and also be more diligent about monitoring her blood sugars and taking her medication. Patient was discharged with prescription for etodolac 4 mg twice a day for  inflammation. She is also given a prescription for tramadol 50 mg 1 every 6 hours if needed for pain. She is to follow-up with her primary care doctor for continued medication as needed.  ____________________________________________   FINAL CLINICAL IMPRESSION(S) / ED DIAGNOSES  Final diagnoses:  Acute low back pain with radicular symptoms, duration less than 6 weeks      NEW MEDICATIONS STARTED DURING THIS VISIT:  Discharge Medication List as of 05/23/2016  3:05 PM    START taking these medications   Details  etodolac (LODINE) 400 MG tablet Take 1 tablet (400 mg total) by mouth 2 (two) times daily., Starting Fri 05/23/2016, Print    traMADol (ULTRAM) 50 MG tablet Take 1 tablet (50 mg total) by mouth every 6 (six) hours as needed., Starting Fri 05/23/2016, Print         Note:  This document was prepared using Dragon voice recognition software and may include unintentional dictation errors.    [name], PA-C 05/23/16 [place], MD 05/24/16 1150

## 2016-05-23 NOTE — Discharge Instructions (Signed)
Call and make an appointment with your primary care doctor or Dr. Marry Guan about your back problems. Begin taking etodolac 400 mg twice a day with food. Do not take anti-inflammatories over-the-counter with this medication. Take diabetes medicine as prescribed by your doctor. Your blood sugar today was 161.

## 2016-06-19 ENCOUNTER — Other Ambulatory Visit: Payer: Self-pay | Admitting: Obstetrics and Gynecology

## 2016-06-19 DIAGNOSIS — Z1231 Encounter for screening mammogram for malignant neoplasm of breast: Secondary | ICD-10-CM

## 2016-09-18 ENCOUNTER — Ambulatory Visit
Admission: RE | Admit: 2016-09-18 | Discharge: 2016-09-18 | Disposition: A | Discharge status: Home / Self Care | Payer: BLUE CROSS/BLUE SHIELD | Source: Ambulatory Visit | Attending: Obstetrics and Gynecology | Admitting: Obstetrics and Gynecology

## 2016-09-18 DIAGNOSIS — Z1231 Encounter for screening mammogram for malignant neoplasm of breast: Secondary | ICD-10-CM

## 2017-05-29 ENCOUNTER — Other Ambulatory Visit: Payer: Self-pay | Admitting: Internal Medicine

## 2017-05-29 ENCOUNTER — Ambulatory Visit
Admission: RE | Admit: 2017-05-29 | Discharge: 2017-05-29 | Disposition: A | Discharge status: Home / Self Care | Payer: Disability Insurance | Source: Ambulatory Visit | Attending: Internal Medicine | Admitting: Internal Medicine

## 2017-05-29 DIAGNOSIS — M4807 Spinal stenosis, lumbosacral region: Secondary | ICD-10-CM | POA: Diagnosis not present

## 2017-05-29 DIAGNOSIS — M5116 Intervertebral disc disorders with radiculopathy, lumbar region: Secondary | ICD-10-CM | POA: Diagnosis not present

## 2017-05-29 DIAGNOSIS — M5136 Other intervertebral disc degeneration, lumbar region: Secondary | ICD-10-CM

## 2017-05-29 DIAGNOSIS — M4316 Spondylolisthesis, lumbar region: Secondary | ICD-10-CM | POA: Insufficient documentation

## 2019-03-25 ENCOUNTER — Other Ambulatory Visit: Payer: Self-pay

## 2019-03-25 ENCOUNTER — Emergency Department
Admission: EM | Admit: 2019-03-25 | Discharge: 2019-03-25 | Disposition: A | Discharge status: Home / Self Care | Payer: Disability Insurance | Attending: Emergency Medicine | Admitting: Emergency Medicine

## 2019-03-25 ENCOUNTER — Encounter: Payer: Self-pay | Admitting: Emergency Medicine

## 2019-03-25 DIAGNOSIS — Z7982 Long term (current) use of aspirin: Secondary | ICD-10-CM | POA: Insufficient documentation

## 2019-03-25 DIAGNOSIS — T162XXA Foreign body in left ear, initial encounter: Secondary | ICD-10-CM | POA: Insufficient documentation

## 2019-03-25 DIAGNOSIS — E119 Type 2 diabetes mellitus without complications: Secondary | ICD-10-CM | POA: Insufficient documentation

## 2019-03-25 DIAGNOSIS — Y929 Unspecified place or not applicable: Secondary | ICD-10-CM | POA: Insufficient documentation

## 2019-03-25 DIAGNOSIS — Z7984 Long term (current) use of oral hypoglycemic drugs: Secondary | ICD-10-CM | POA: Insufficient documentation

## 2019-03-25 DIAGNOSIS — Z8673 Personal history of transient ischemic attack (TIA), and cerebral infarction without residual deficits: Secondary | ICD-10-CM | POA: Insufficient documentation

## 2019-03-25 DIAGNOSIS — Z87891 Personal history of nicotine dependence: Secondary | ICD-10-CM | POA: Insufficient documentation

## 2019-03-25 DIAGNOSIS — Z79899 Other long term (current) drug therapy: Secondary | ICD-10-CM | POA: Insufficient documentation

## 2019-03-25 DIAGNOSIS — Y939 Activity, unspecified: Secondary | ICD-10-CM | POA: Insufficient documentation

## 2019-03-25 DIAGNOSIS — Y33XXXA Other specified events, undetermined intent, initial encounter: Secondary | ICD-10-CM | POA: Insufficient documentation

## 2019-03-25 DIAGNOSIS — I10 Essential (primary) hypertension: Secondary | ICD-10-CM | POA: Insufficient documentation

## 2019-03-25 DIAGNOSIS — Y999 Unspecified external cause status: Secondary | ICD-10-CM | POA: Insufficient documentation

## 2019-03-25 NOTE — ED Triage Notes (Signed)
Patient ambulatory to triage with steady gait, without difficulty or distress noted, mask in place; pt reports ?bug in left ear; awoke with movement in ear

## 2019-03-25 NOTE — ED Provider Notes (Signed)
[facility] Emergency Department Provider Note    First MD Initiated Contact with Patient 03/25/19 0335     (approximate)  I have reviewed the triage vital signs and the nursing notes.   HISTORY  Chief Complaint Foreign Body   HPI Maria Gordon is a 58 y.o. female presents to the emergency department secondary to "bug in my left ear".  Patient states that she has felt a insect moving in her left ear tonight.  Patient states that she did scratch the inside of her ear with a hairpin and noted some bleeding.        Past Medical History:  Diagnosis Date   Anemia    Blood transfusion without reported diagnosis    Diabetes mellitus without complication ([place])    GERD (gastroesophageal reflux disease)    Hyperlipidemia    Hypertension    Stroke ([place])     There are no active problems to display for this patient.   Past Surgical History:  Procedure Laterality Date   UTERINE FIBROID SURGERY      Prior to Admission medications   Medication Sig Start Date End Date Taking? Authorizing Provider  albuterol (PROVENTIL HFA;VENTOLIN HFA) 108 (90 BASE) MCG/ACT inhaler Inhale 2 puffs into the lungs every 6 (six) hours as needed for wheezing or shortness of breath. 03/15/15   Drenda Freeze, MD  aspirin EC 81 MG tablet Take 81 mg by mouth daily.    [provider]  etodolac (LODINE) 400 MG tablet Take 1 tablet (400 mg total) by mouth 2 (two) times daily. 05/23/16   [name], PA-C  GLIPIZIDE XL 5 MG 24 hr tablet Take 1 tablet by mouth daily. 05/13/13   [provider]  ibuprofen (ADVIL,MOTRIN) 800 MG tablet Take 1 tablet (800 mg total) by mouth every 8 (eight) hours as needed. 09/09/15   Duanne Guess, PA-C  lisinopril-hydrochlorothiazide (PRINZIDE,ZESTORETIC) 20-12.5 MG per tablet Take 1 tablet by mouth daily. 05/13/13   [provider]  metFORMIN (GLUCOPHAGE-XR) 500 MG 24 hr tablet Take 1 tablet by mouth daily.  04/22/13   [provider]  traMADol (ULTRAM) 50 MG tablet Take 1 tablet (50 mg total) by mouth every 6 (six) hours as needed. 05/23/16   [name], PA-C    Allergies Sulfa antibiotics and Shrimp [shellfish allergy]  Family History  Problem Relation Age of Onset   Lung cancer Brother    Breast cancer Neg Hx     Social History Social History   Tobacco Use   Smoking status: Former Smoker    Packs/day: 1.00    Years: 25.00    Pack years: 25.00   Smokeless tobacco: Never Used  Substance Use Topics   Alcohol use: No   Drug use: No    Review of Systems Constitutional: No fever/chills Eyes: No visual changes. ENT: No sore throat.  Positive for insect in left ear. Cardiovascular: Denies chest pain. Respiratory: Denies shortness of breath. Gastrointestinal: No abdominal pain.  No nausea, no vomiting.  No diarrhea.  No constipation. Genitourinary: Negative for dysuria. Musculoskeletal: Negative for neck pain.  Negative for back pain. Integumentary: Negative for rash. Neurological: Negative for headaches, focal weakness or numbness.   ____________________________________________   PHYSICAL EXAM:  VITAL SIGNS: ED Triage Vitals  Enc Vitals Group     BP 03/25/19 0330 (!) 163/86     Pulse Rate 03/25/19 0330 92     Resp 03/25/19 0330 17  Temp 03/25/19 0330 98.3 F (36.8 C)     Temp Source 03/25/19 0330 Oral     SpO2 03/25/19 0330 99 %     Weight 03/25/19 0331 118.8 kg (262 lb)     Height 03/25/19 0331 1.753 m (5\' 9" )     Head Circumference --      Peak Flow --      Pain Score 03/25/19 0329 0     Pain Loc --      Pain Edu? --      Excl. in [place]? --     Constitutional: Alert and oriented.  Eyes: Conjunctivae are normal.  Head: Atraumatic. Ears: No insect noted in the external auditory canal bilaterally.  Abrasions noted to the left EAC with scant bleeding Neurologic:  Normal speech and language. No gross focal neurologic deficits are  appreciated.  Skin:  Skin is warm, dry and intact. Psychiatric: Mood and affect are normal. Speech and behavior are normal.  ____  Procedures   ____________________________________________   INITIAL IMPRESSION / MDM / ASSESSMENT AND PLAN / ED COURSE  As part of my medical decision making, I reviewed the following data within the electronic MEDICAL RECORD NUMBER  58 year old female presenting with above-stated history and physical exam secondary to concern for possible insect in the left ear.  Abrasion noted in the left EAC which the patient states were inflicted with a hairpin.  Left ear was irrigated with 20 mL's of normal saline.  ____________________________________________  FINAL CLINICAL IMPRESSION(S) / ED DIAGNOSES  Final diagnoses:  Foreign body of left ear, initial encounter     MEDICATIONS GIVEN DURING THIS VISIT:  Medications - No data to display   ED Discharge Orders    None      *Please note:  [name] was evaluated in Emergency Department on 03/25/2019 for the symptoms described in the history of present illness. She was evaluated in the context of the global COVID-19 pandemic, which necessitated consideration that the patient might be at risk for infection with the SARS-CoV-2 virus that causes COVID-19. Institutional protocols and algorithms that pertain to the evaluation of patients at risk for COVID-19 are in a state of rapid change based on information released by regulatory bodies including the CDC and federal and state organizations. These policies and algorithms were followed during the patient's care in the ED.  Some ED evaluations and interventions may be delayed as a result of limited staffing during the pandemic.*  Note:  This document was prepared using Dragon voice recognition software and may include unintentional dictation errors.   Gregor Hams, MD 03/25/19 [phone]

## 2019-04-19 ENCOUNTER — Other Ambulatory Visit: Payer: Self-pay | Admitting: Obstetrics and Gynecology

## 2019-04-19 DIAGNOSIS — Z1231 Encounter for screening mammogram for malignant neoplasm of breast: Secondary | ICD-10-CM

## 2019-09-01 ENCOUNTER — Ambulatory Visit
Admission: RE | Admit: 2019-09-01 | Discharge: 2019-09-01 | Disposition: A | Discharge status: Home / Self Care | Payer: 59 | Source: Ambulatory Visit | Attending: Obstetrics and Gynecology | Admitting: Obstetrics and Gynecology

## 2019-09-01 DIAGNOSIS — Z1231 Encounter for screening mammogram for malignant neoplasm of breast: Secondary | ICD-10-CM

## 2020-05-27 IMAGING — MG DIGITAL SCREENING BILAT W/ TOMO W/ CAD
8 series · 8 of 24 positions shown · non-contrast
Comparison: Previous exam(s).

ACR Breast Density Category a: The breast tissue is almost entirely
fatty.

CLINICAL DATA: Screening.

EXAM:
DIGITAL SCREENING BILATERAL MAMMOGRAM WITH TOMO AND CAD

[L CC synth-2D]
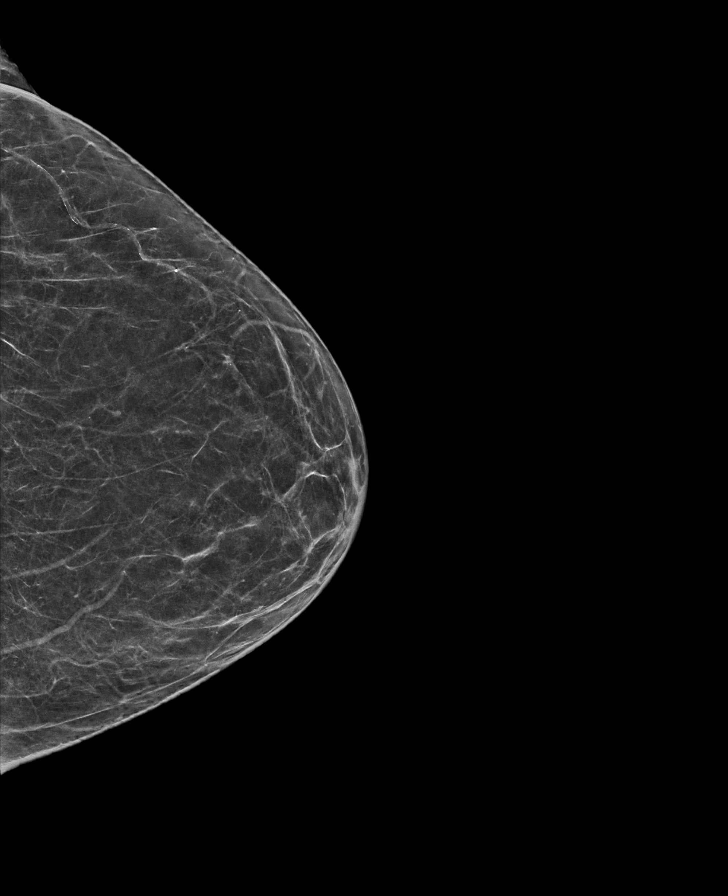

[L MLO synth-2D]
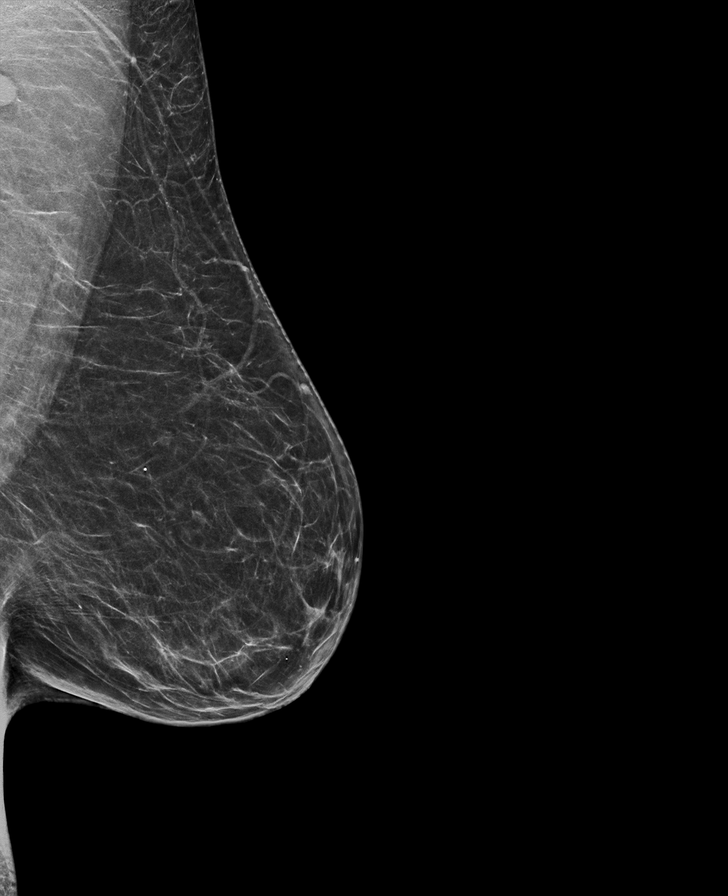

[R MLO synth-2D]
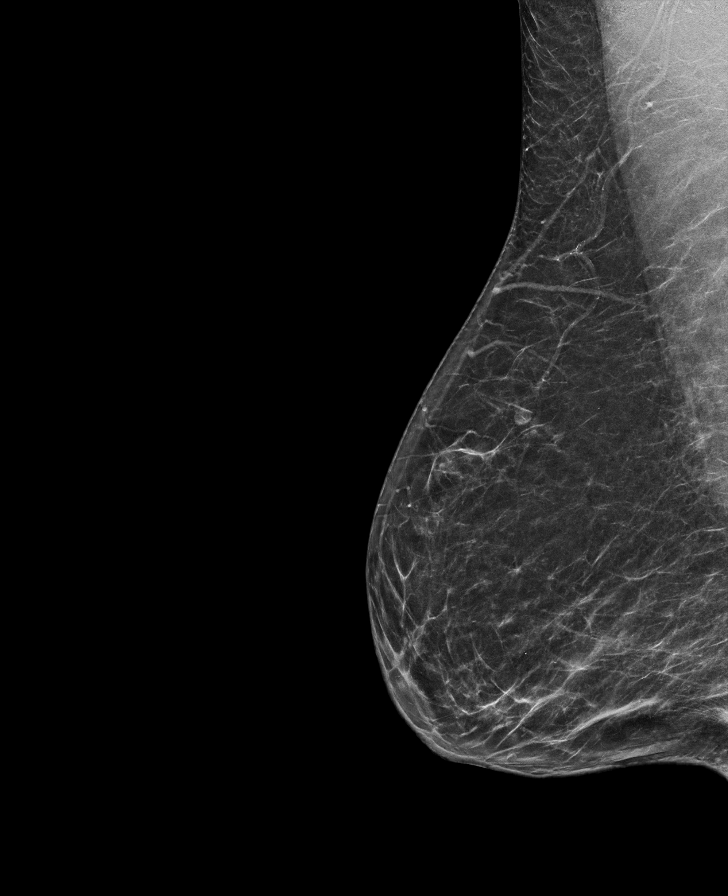

[R CC synth-2D]
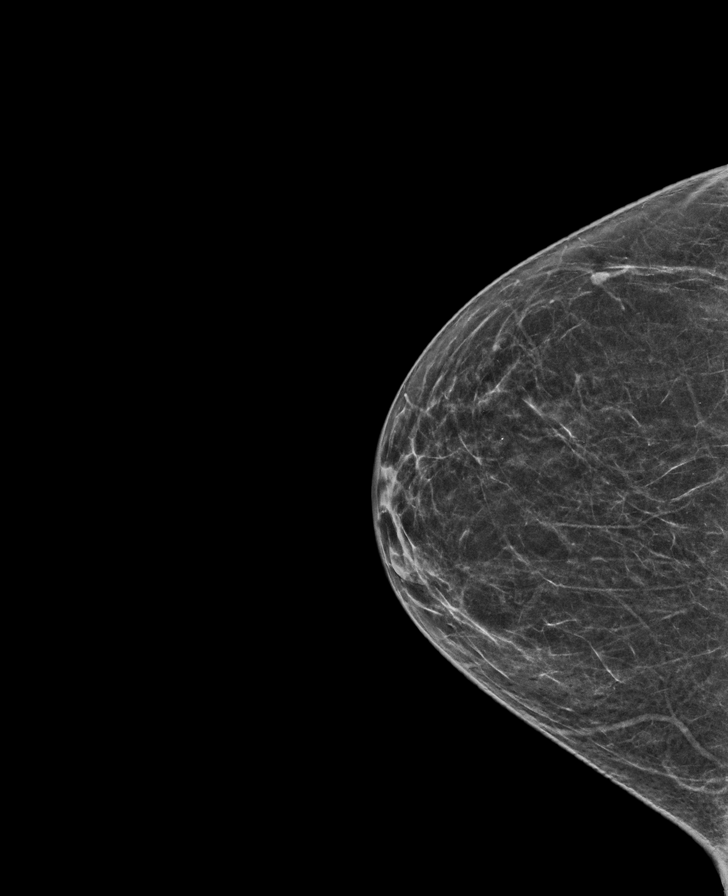

[L MLO tomo · tomo slice 33/65.0]
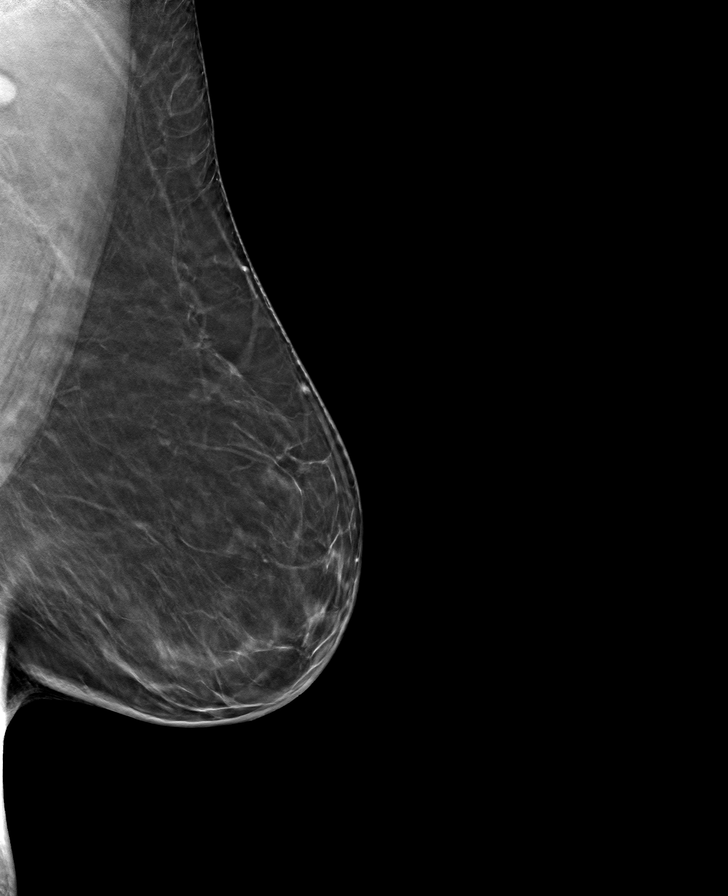

[R MLO tomo · tomo slice 35/68.0]
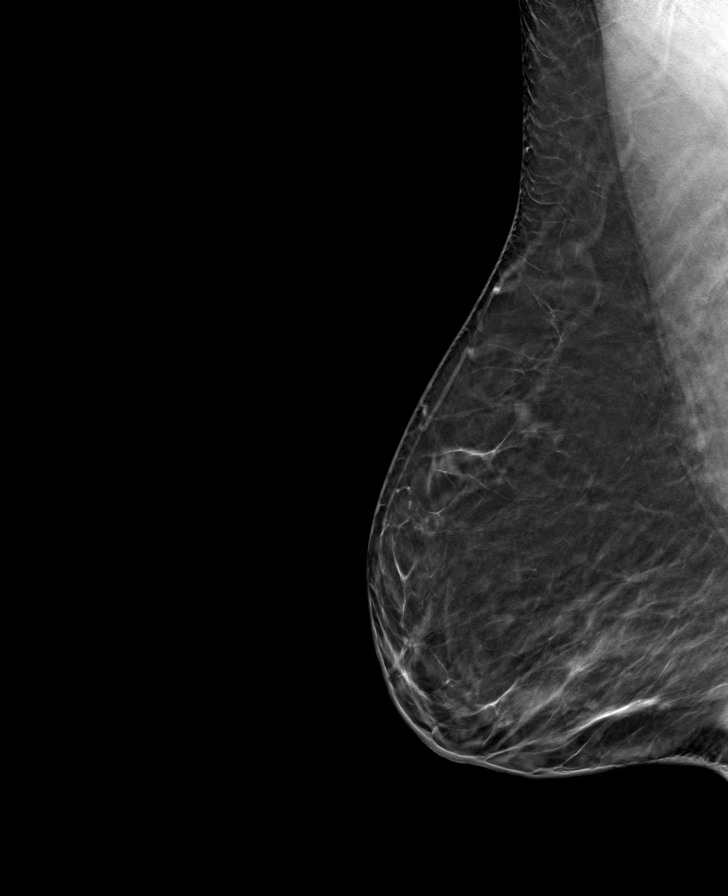

[R CC tomo · tomo slice 28/55.0]
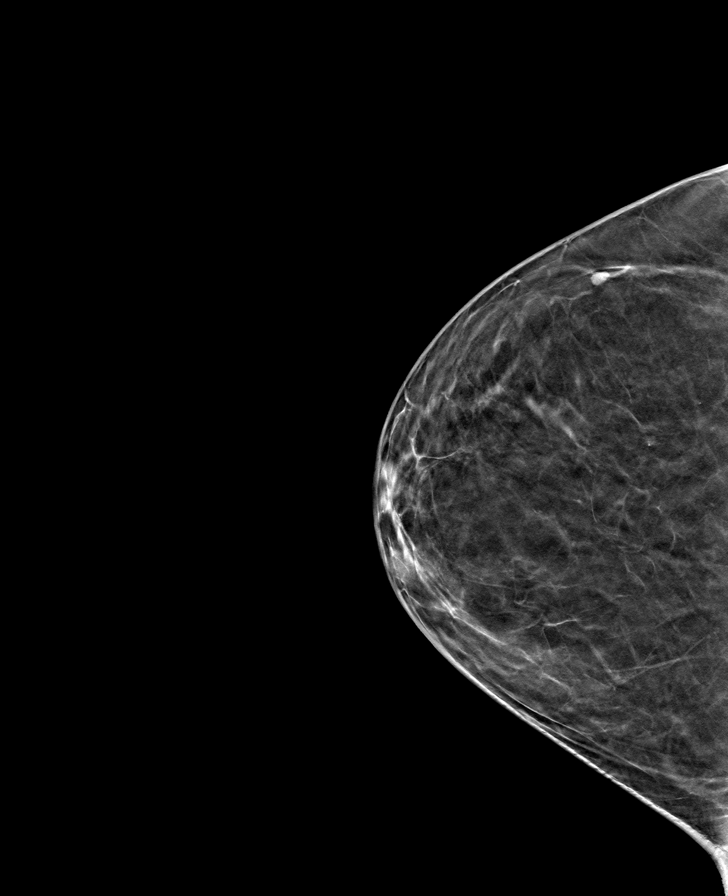

[L CC tomo · tomo slice 29/58.0]
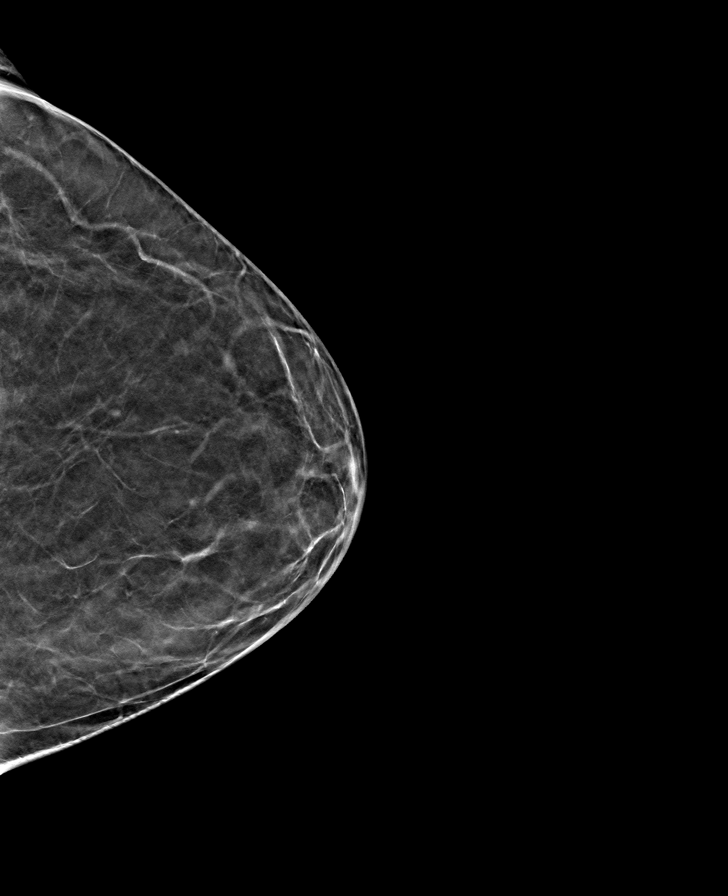

[8 of 24 positions shown; findings below may reference images not displayed]

FINDINGS: There are no findings suspicious for malignancy. Images were
processed with CAD.
IMPRESSION: No mammographic evidence of malignancy. A result letter of this
screening mammogram will be mailed directly to the patient.

RECOMMENDATION:
Screening mammogram in one year. (Code:8Y-Q-VVS)

BI-RADS CATEGORY  1: Negative.

## 2022-11-20 ENCOUNTER — Encounter: Payer: Self-pay | Admitting: Obstetrics and Gynecology

## 2022-11-20 ENCOUNTER — Other Ambulatory Visit: Payer: Self-pay

## 2022-11-20 DIAGNOSIS — Z1231 Encounter for screening mammogram for malignant neoplasm of breast: Secondary | ICD-10-CM

## 2023-01-30 ENCOUNTER — Telehealth: Payer: Self-pay | Admitting: Hematology and Oncology

## 2023-02-05 ENCOUNTER — Other Ambulatory Visit: Payer: Self-pay

## 2023-02-05 DIAGNOSIS — Z1231 Encounter for screening mammogram for malignant neoplasm of breast: Secondary | ICD-10-CM

## 2023-02-05 NOTE — Telephone Encounter (Signed)
I have left a detailed message for this patient advising she does not need to present for her [facility] appt as she can be transitioned to [employer]. I have asked the pt to please return the call to [phone] and request to speak with Joellyn Quails.

## 2023-02-10 ENCOUNTER — Ambulatory Visit: Payer: Disability Insurance

## 2023-02-10 ENCOUNTER — Ambulatory Visit: Payer: Disability Insurance | Attending: Hematology and Oncology

## 2023-05-04 ENCOUNTER — Ambulatory Visit
Admission: RE | Admit: 2023-05-04 | Discharge: 2023-05-04 | Disposition: A | Discharge status: Home / Self Care | Payer: Disability Insurance | Source: Ambulatory Visit | Attending: Family Medicine | Admitting: Family Medicine

## 2023-05-04 ENCOUNTER — Ambulatory Visit: Payer: Self-pay | Attending: Hematology and Oncology | Admitting: Hematology and Oncology

## 2023-05-04 VITALS — BP 161/75 | Wt 261.2 lb

## 2023-05-04 DIAGNOSIS — Z1231 Encounter for screening mammogram for malignant neoplasm of breast: Secondary | ICD-10-CM

## 2023-05-04 NOTE — Patient Instructions (Addendum)
Taught Marcial Pacas about self breast awareness and gave educational materials to take home. Patient did not need a Pap smear today due to last Pap smear was in 01/31/2020 per patient.  Let her know [facility] will cover Pap smears every 5 years unless has a history of abnormal Pap smears. Referred patient to the [facility] for screening mammogram. Appointment scheduled for 05/04/2023. Patient aware of appointment and will be there. Let patient know will follow up with her within the next couple weeks with results. Maria Gordon verbalized understanding.  Pascal Lux, NP 10:56 AM

## 2023-05-04 NOTE — Progress Notes (Signed)
Ms. JOANNA NEUHAUS is a 62 y.o. female who presents to [facility] clinic today with no complaints.    Pap Smear: Pap not smear completed today. Last Pap smear was 01/31/2020 at [facility] clinic and was normal. Per patient has no history of an abnormal Pap smear. Last Pap smear result is available in Epic.   Physical exam: Breasts Breasts symmetrical. No skin abnormalities bilateral breasts. No nipple retraction bilateral breasts. No nipple discharge bilateral breasts. No lymphadenopathy. No lumps palpated bilateral breasts.   MM 3D SCREEN BREAST BILATERAL  Result Date: 09/02/2019 CLINICAL DATA:  Screening. EXAM: DIGITAL SCREENING BILATERAL MAMMOGRAM WITH TOMO AND CAD COMPARISON:  Previous exam(s). ACR Breast Density Category a: The breast tissue is almost entirely fatty. FINDINGS: There are no findings suspicious for malignancy. Images were processed with CAD. IMPRESSION: No mammographic evidence of malignancy. A result letter of this screening mammogram will be mailed directly to the patient. RECOMMENDATION: Screening mammogram in one year. (Code:SM-B-01Y) BI-RADS CATEGORY  1: Negative. Electronically Signed   By: Amie Portland M.D.   On: 09/02/2019 10:08        Pelvic/Bimanual Pap is not indicated today    Smoking History: Patient is a former smoker and was not referred to quit line.    Patient Navigation: Patient education provided. Access to services provided for patient through [facility] program. No interpreter provided. No transportation provided   Colorectal Cancer Screening: Per patient has never had colonoscopy completed No complaints today. FIT test given per [facility] clinic with results pending.    Breast and Cervical Cancer Risk Assessment: Patient has family history of breast cancer, with her daughter. Patient does not have history of cervical dysplasia, immunocompromised, or DES exposure in-utero.   Risk Scores as of Encounter on 05/04/2023     Dondra Spry           5-year 1.18%    Lifetime 5.05%            Last calculated by Meryl Dare, CMA on 05/04/2023 at 11:09 AM           A: [facility] exam without pap smear No complaints with benign exam.   P: Referred patient to the [facility] for a screening mammogram. Appointment scheduled 05/04/2023.  Pascal Lux, NP 05/04/2023 11:04 AM

## 2024-04-26 ENCOUNTER — Ambulatory Visit: Admitting: Podiatry
